# Patient Record
Sex: Female | Born: 1955 | Race: White | Hispanic: No | Smoking: Current every day smoker
Health system: Southern US, Community
[De-identification: ages and names within clinical notes are randomized; demographics above are authoritative.]

## PROBLEM LIST (undated history)

## (undated) DIAGNOSIS — F32A Depression, unspecified: Secondary | ICD-10-CM

## (undated) DIAGNOSIS — I10 Essential (primary) hypertension: Secondary | ICD-10-CM

## (undated) HISTORY — PX: ABDOMINAL SURGERY: SHX537

## (undated) HISTORY — PX: ABDOMINAL HYSTERECTOMY: SHX81

## (undated) HISTORY — PX: BREAST SURGERY: SHX581

---

## 2008-12-31 ENCOUNTER — Ambulatory Visit: Payer: Self-pay | Admitting: Oncology

## 2009-01-01 ENCOUNTER — Ambulatory Visit: Payer: Self-pay | Admitting: Oncology

## 2009-01-30 ENCOUNTER — Ambulatory Visit: Payer: Self-pay | Admitting: Oncology

## 2014-01-04 ENCOUNTER — Ambulatory Visit: Payer: Self-pay

## 2014-01-04 LAB — COMPREHENSIVE METABOLIC PANEL
AST: 36 U/L (ref 15–37)
Albumin: 3.6 g/dL (ref 3.4–5.0)
Alkaline Phosphatase: 111 U/L
Anion Gap: 7 (ref 7–16)
BILIRUBIN TOTAL: 0.9 mg/dL (ref 0.2–1.0)
BUN: 8 mg/dL (ref 7–18)
CO2: 26 mmol/L (ref 21–32)
CREATININE: 0.9 mg/dL (ref 0.60–1.30)
Calcium, Total: 9 mg/dL (ref 8.5–10.1)
Chloride: 106 mmol/L (ref 98–107)
EGFR (African American): >60
EGFR (Non-African Amer.): >60
Glucose: 112 mg/dL — ABNORMAL HIGH (ref 65–99)
Osmolality: 277 (ref 275–301)
Potassium: 4.2 mmol/L (ref 3.5–5.1)
SGPT (ALT): 37 U/L
Sodium: 139 mmol/L (ref 136–145)
Total Protein: 7.2 g/dL (ref 6.4–8.2)

## 2014-01-04 LAB — LIPID PANEL
CHOLESTEROL: 176 mg/dL (ref 0–200)
HDL: 62 mg/dL — AB (ref 40–60)
Ldl Cholesterol, Calc: 70 mg/dL (ref 0–100)
TRIGLYCERIDES: 219 mg/dL — AB (ref 0–200)
VLDL Cholesterol, Calc: 44 mg/dL — ABNORMAL HIGH (ref 5–40)

## 2014-01-04 LAB — CBC WITH DIFFERENTIAL/PLATELET
BASOS ABS: 0.1 10*3/uL (ref 0.0–0.1)
Basophil %: 1 %
Eosinophil #: 0.2 10*3/uL (ref 0.0–0.7)
Eosinophil %: 2.2 %
HCT: 54.6 % — ABNORMAL HIGH (ref 35.0–47.0)
HGB: 18.5 g/dL — AB (ref 12.0–16.0)
Lymphocyte #: 1.5 10*3/uL (ref 1.0–3.6)
Lymphocyte %: 18.9 %
MCH: 32.6 pg (ref 26.0–34.0)
MCHC: 34 g/dL (ref 32.0–36.0)
MCV: 96 fL (ref 80–100)
MONO ABS: 0.7 x10 3/mm (ref 0.2–0.9)
Monocyte %: 9.6 %
NEUTROS ABS: 5.3 10*3/uL (ref 1.4–6.5)
Neutrophil %: 68.3 %
PLATELETS: 239 10*3/uL (ref 150–440)
RBC: 5.69 10*6/uL — ABNORMAL HIGH (ref 3.80–5.20)
RDW: 12.4 % (ref 11.5–14.5)
WBC: 7.7 10*3/uL (ref 3.6–11.0)

## 2014-01-04 LAB — HEMOGLOBIN A1C: Hemoglobin A1C: 5.6 % (ref 4.2–6.3)

## 2014-01-04 LAB — TSH: Thyroid Stimulating Horm: 2.49 u[IU]/mL

## 2015-08-10 IMAGING — CR DG CHEST 2V
1 series · 2 of 2 positions shown · non-contrast
Comparison: None.

CLINICAL DATA: Cough

EXAM:
CHEST  2 VIEW

[Series 1: w chest pa · 0.14mm/px · 2 of 2 slices shown]
[im 1/2]
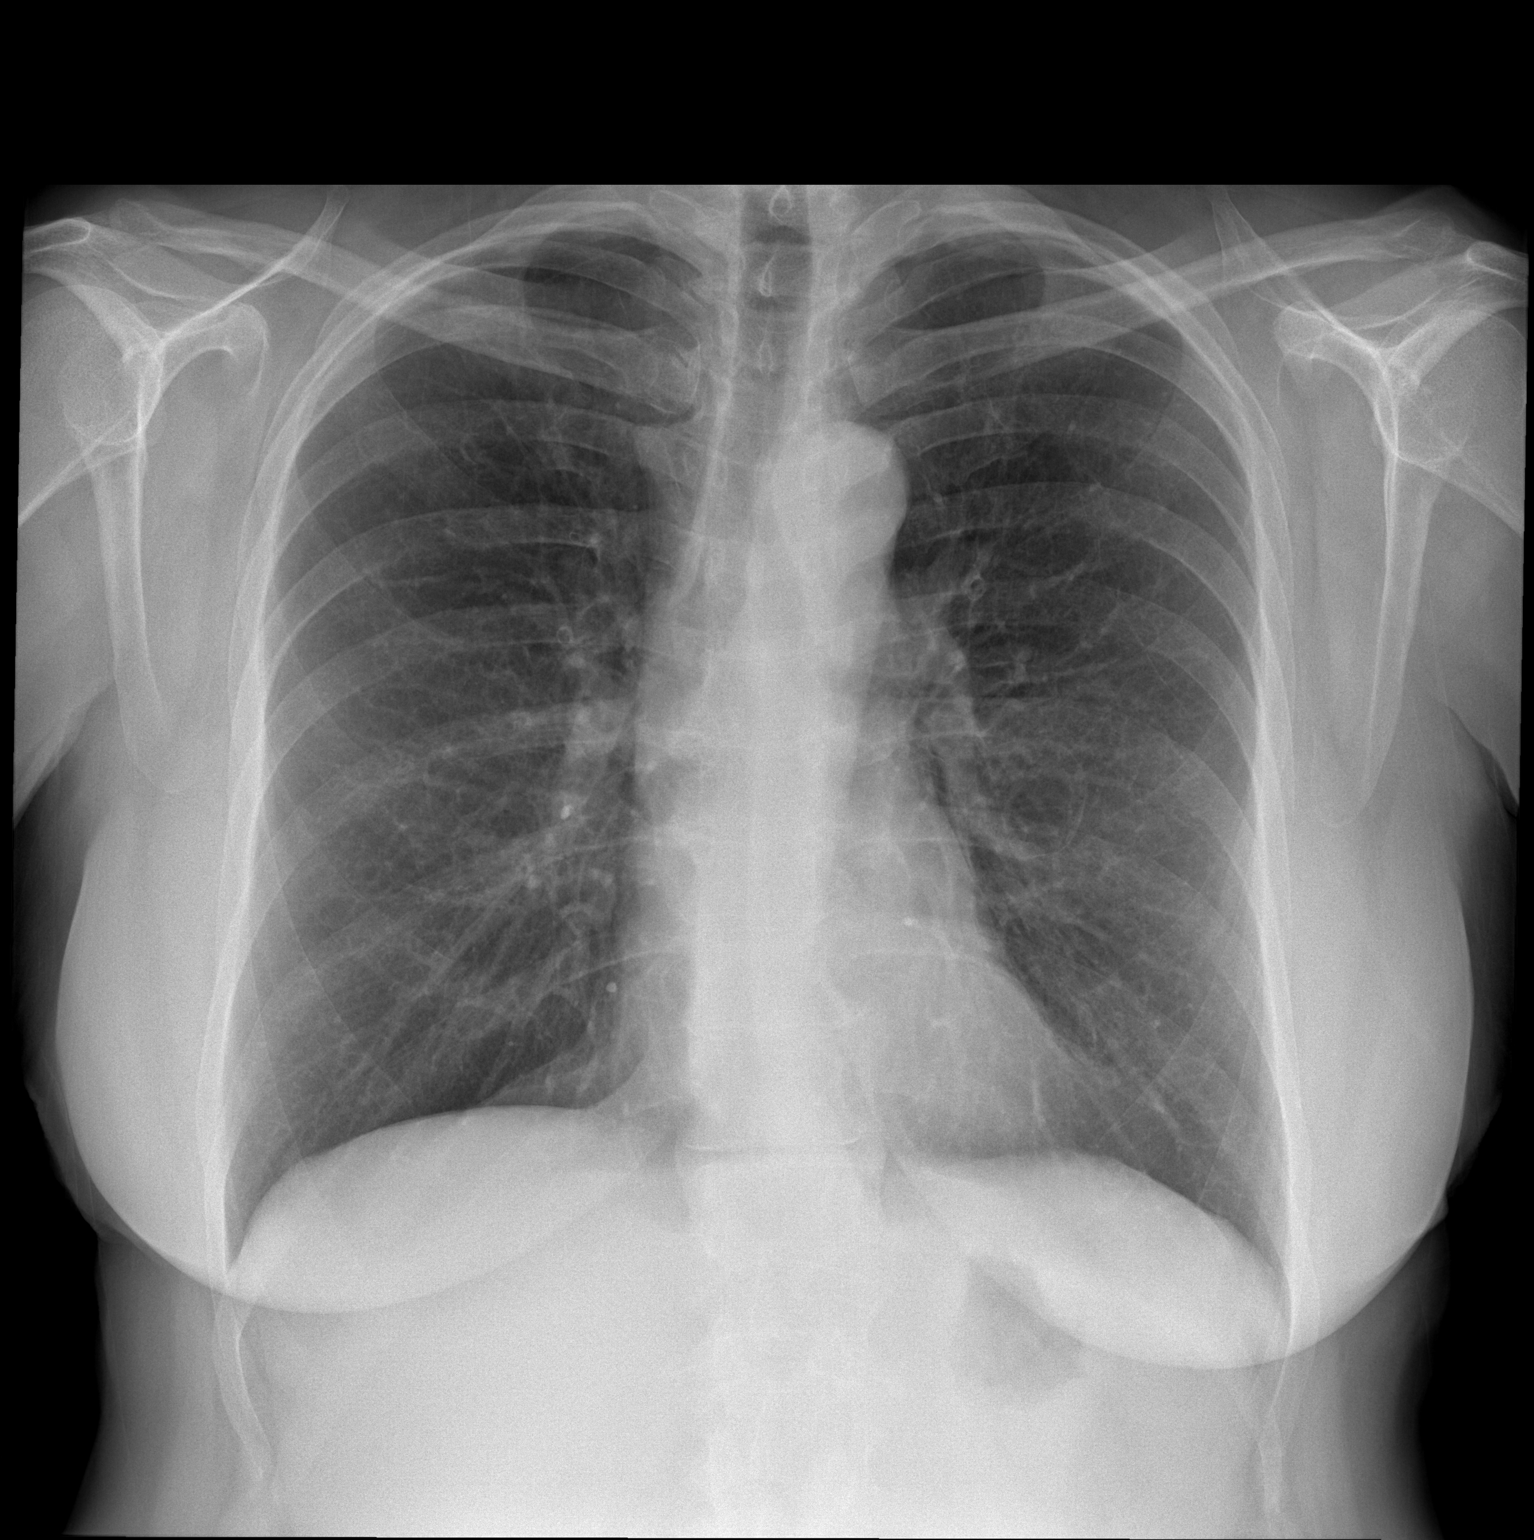
[im 2/2]
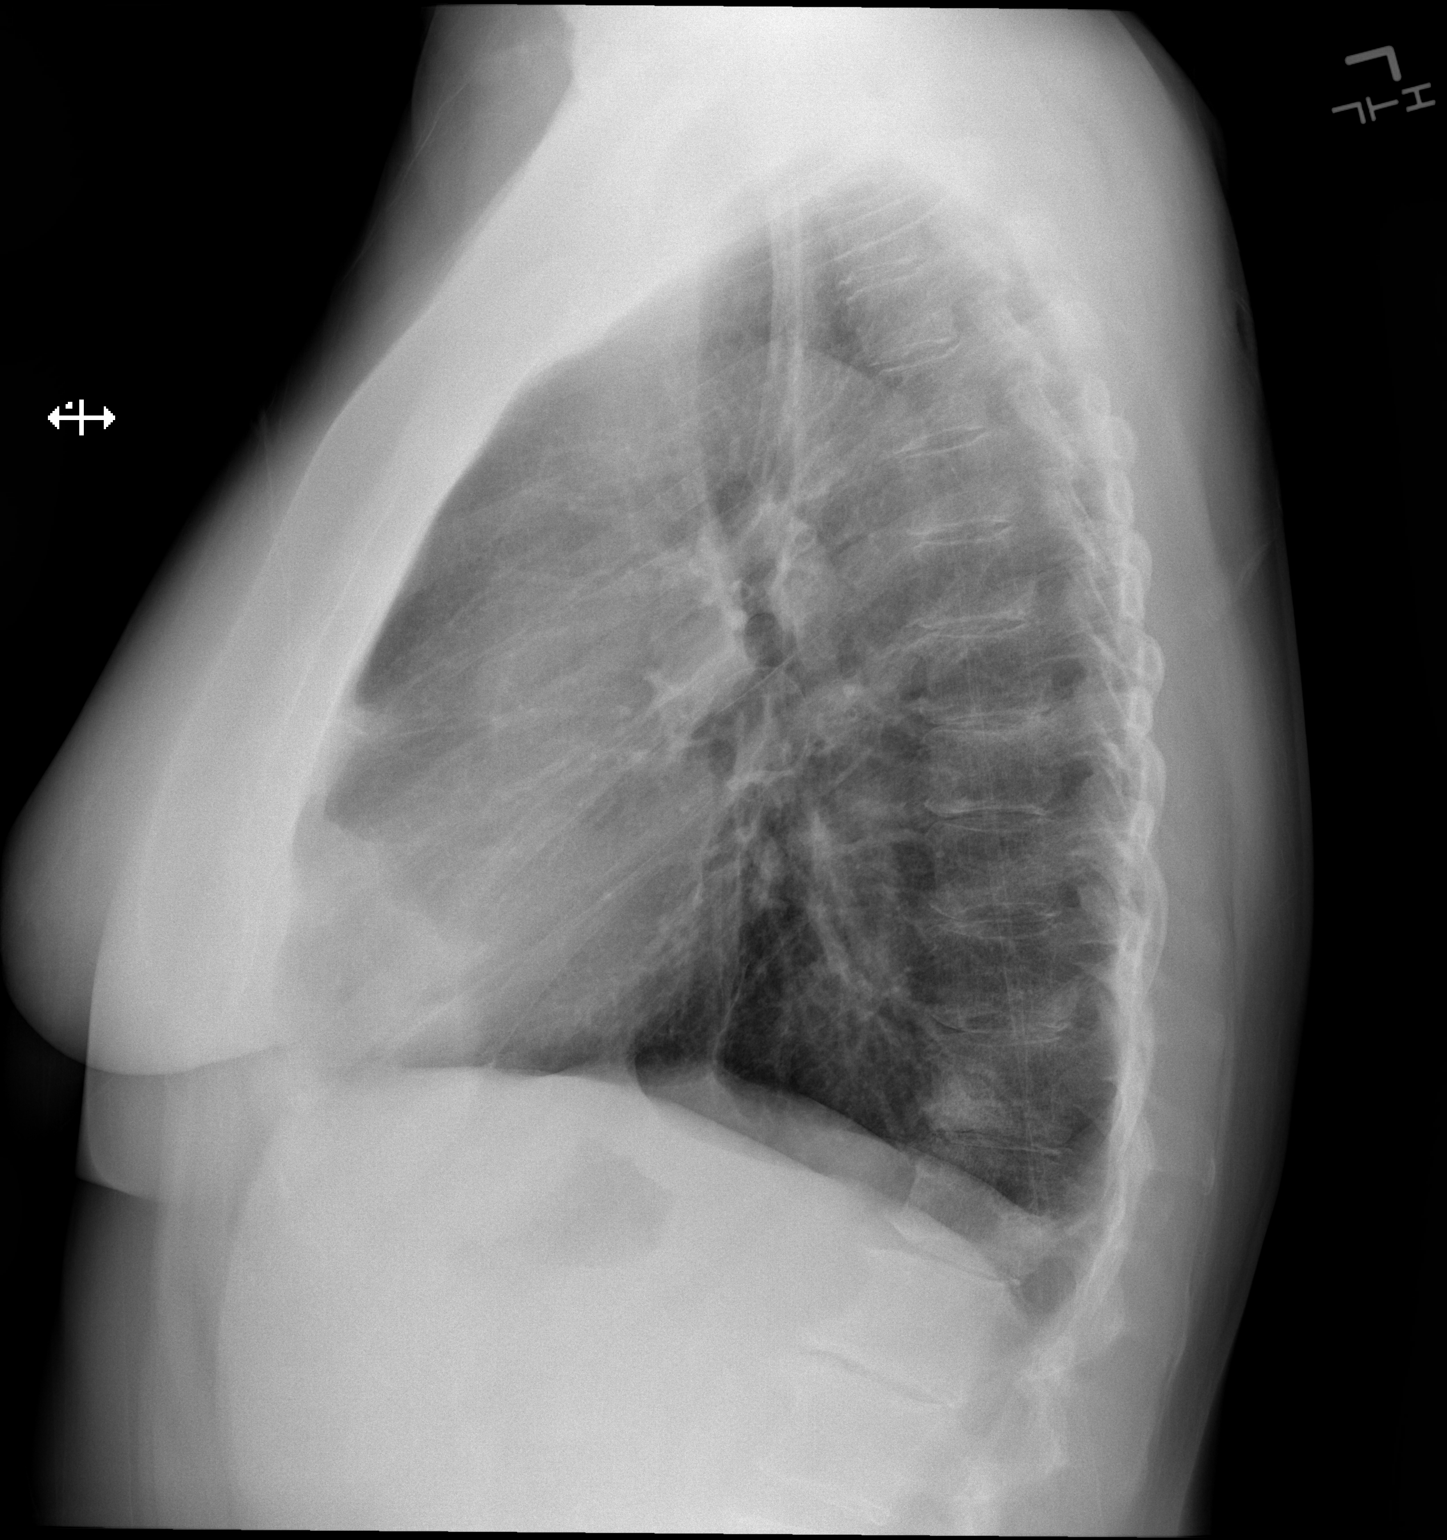

[2 of 2 positions shown; findings below may reference images not displayed]

FINDINGS: The heart size and mediastinal contours are within normal limits.
Both lungs are clear. The visualized skeletal structures are
unremarkable. There is focal kyphosis at the approximate T12 level
with approximately 5% wedge compression deformity anteriorly at that
level. This is of unknown chronicity.
IMPRESSION: No active cardiopulmonary disease.

## 2017-03-07 ENCOUNTER — Encounter: Payer: Self-pay | Admitting: Emergency Medicine

## 2017-03-07 ENCOUNTER — Other Ambulatory Visit: Payer: Self-pay

## 2017-03-07 ENCOUNTER — Emergency Department
Admission: EM | Admit: 2017-03-07 | Discharge: 2017-03-07 | Disposition: A | Discharge status: Home / Self Care | Payer: Managed Care, Other (non HMO) | Attending: Student in an Organized Health Care Education/Training Program | Admitting: Student in an Organized Health Care Education/Training Program

## 2017-03-07 DIAGNOSIS — I159 Secondary hypertension, unspecified: Secondary | ICD-10-CM

## 2017-03-07 DIAGNOSIS — F17210 Nicotine dependence, cigarettes, uncomplicated: Secondary | ICD-10-CM | POA: Diagnosis not present

## 2017-03-07 DIAGNOSIS — T465X6A Underdosing of other antihypertensive drugs, initial encounter: Secondary | ICD-10-CM | POA: Insufficient documentation

## 2017-03-07 HISTORY — DX: Essential (primary) hypertension: I10

## 2017-03-07 MED ORDER — AMLODIPINE BESYLATE 5 MG PO TABS: 5.0000 mg | ORAL_TABLET | Freq: Every day | ORAL | 0 refills | Status: AC

## 2017-03-07 NOTE — ED Notes (Signed)
Pt reports that she has hypertension and [facility] brought her to the ED - BP 155/112 at walk-in

## 2017-03-07 NOTE — ED Provider Notes (Signed)
[facility] Emergency Department Provider Note    First MD Initiated Contact with Patient 03/07/17 1918     (approximate)  I have reviewed the triage vital signs and the nursing notes.   HISTORY  Chief Complaint Hypertension    HPI Leslie James is a 61 y.o. female presents for evaluation of hypertension.  Patient stopped taking her lisinopril several months ago to 2 ACE inhibitor induced cough.  States her blood pressure has been elevated in the past several weeks.  Started noting some blurry vision but just recently got some new glasses.  No numbness or tingling.  States she has a mild headache but not severe.  Denies any chest pain.  No shortness of breath.  No lower extremity edema.  No orthopnea.  Past Medical History:  Diagnosis Date  . Hypertension    No family history on file. Past Surgical History:  Procedure Laterality Date  . ABDOMINAL HYSTERECTOMY    . ABDOMINAL SURGERY    . BREAST SURGERY     Tumor removal; benign   There are no active problems to display for this patient.     Prior to Admission medications   Not on File    Allergies Codeine and Erythromycin    Social History Social History   Tobacco Use  . Smoking status: Current Every Day Smoker    Types: Cigarettes  . Smokeless tobacco: Never Used  Substance Use Topics  . Alcohol use: Yes    Alcohol/week: 12.6 oz    Types: 21 Cans of beer per week    Frequency: Never  . Drug use: No    Review of Systems Patient denies headaches, rhinorrhea, blurry vision, numbness, shortness of breath, chest pain, edema, cough, abdominal pain, nausea, vomiting, diarrhea, dysuria, fevers, rashes or hallucinations unless otherwise stated above in HPI. ____________________________________________   PHYSICAL EXAM:  VITAL SIGNS: Vitals:   03/07/17 1726  BP: (!) 163/105  Pulse: (!) 103  Resp: 16  Temp: 98.2 F (36.8 C)  SpO2: 100%    Constitutional: Alert and oriented.  Well appearing and in no acute distress. Eyes: Conjunctivae are normal. Crisp optic disc Head: Atraumatic. Nose: No congestion/rhinnorhea. Mouth/Throat: Mucous membranes are moist.   Neck: No stridor. Painless ROM.  Cardiovascular: Normal rate, regular rhythm. Grossly normal heart sounds.  Good peripheral circulation. Respiratory: Normal respiratory effort.  No retractions. Lungs CTAB. Gastrointestinal: Soft and nontender. No distention. No abdominal bruits. No CVA tenderness. Genitourinary:  Musculoskeletal: No lower extremity tenderness nor edema.  No joint effusions. Neurologic:  Normal speech and language. No gross focal neurologic deficits are appreciated. No facial droop Skin:  Skin is warm, dry and intact. No rash noted. Psychiatric: Mood and affect are normal. Speech and behavior are normal.  ____________________________________________   LABS (all labs ordered are listed, but only abnormal results are displayed)  No results found for this or any previous visit (from the past 24 hour(s)). ____________________________________________ ________________________________   PROCEDURES  Procedure(s) performed:  Procedures    Critical Care performed: no ____________________________________________   INITIAL IMPRESSION / ASSESSMENT AND PLAN / ED COURSE  Pertinent labs & imaging results that were available during my care of the patient were reviewed by me and considered in my medical decision making (see chart for details).  DDX: htn, medication, noncompliance, stress  Leslie James is a 61 y.o. who presents to the ED with chief complaint of headaches and hypertension as described above.  She has no meningeal irritation.  No focal neuro deficits.  Blood pressure is only mildly elevated.  No chest pain or shortness of breath.  Patient has grossly asymptomatic hypertension.  Her abdominal exam is soft and benign.  No lower extremity edema.  Patient will be started on Norvasc due  to previous intolerances with other medications.  Patient will be given referral to PCP for further evaluation.      ____________________________________________   FINAL CLINICAL IMPRESSION(S) / ED DIAGNOSES  Final diagnoses:  Secondary hypertension      NEW MEDICATIONS STARTED DURING THIS VISIT:  This SmartLink is deprecated. Use AVSMEDLIST instead to display the medication list for a patient.   Note:  This document was prepared using Dragon voice recognition software and may include unintentional dictation errors.    Merlyn Lot, MD 03/07/17 2006

## 2017-03-07 NOTE — ED Triage Notes (Signed)
Pt sent over from [facility] due to hypertension and headaches x approximately one week, pt reports she used to be on Lisinopril but came off of it due to side effects without following up.  Pt reports intermittent blurred vision, denies any dizziness.  NAD noted at this time.

## 2021-05-06 ENCOUNTER — Encounter: Payer: Self-pay | Admitting: Gastroenterology

## 2021-05-06 NOTE — Anesthesia Preprocedure Evaluation (Addendum)
Anesthesia Evaluation  Patient identified by MRN, date of birth, ID band Patient awake    Reviewed: Allergy & Precautions, Patient's Chart, lab work & pertinent test results  History of Anesthesia Complications (+) PONV and history of anesthetic complications  Airway Mallampati: II  TM Distance: >3 FB Neck ROM: Full    Dental no notable dental hx.    Pulmonary asthma , Current Smoker,    Pulmonary exam normal breath sounds clear to auscultation       Cardiovascular hypertension, Normal cardiovascular exam Rhythm:Regular Rate:Normal     Neuro/Psych Depression    GI/Hepatic   Endo/Other    Renal/GU      Musculoskeletal   Abdominal   Peds  Hematology   Anesthesia Other Findings   Reproductive/Obstetrics                           Anesthesia Physical Anesthesia Plan  ASA: 3  Anesthesia Plan: General   Post-op Pain Management:    Induction: Intravenous  PONV Risk Score and Plan:   Airway Management Planned: Natural Airway and Nasal Cannula  Additional Equipment:   Intra-op Plan:   Post-operative Plan:   Informed Consent: I have reviewed the patients History and Physical, chart, labs and discussed the procedure including the risks, benefits and alternatives for the proposed anesthesia with the patient or authorized representative who has indicated his/her understanding and acceptance.     Dental Advisory Given  Plan Discussed with: Anesthesiologist, CRNA and Surgeon  Anesthesia Plan Comments: (Patient consented for risks of anesthesia including but not limited to:  - adverse reactions to medications - risk of airway placement if required - damage to eyes, teeth, lips or other oral mucosa - nerve damage due to positioning  - sore throat or hoarseness - Damage to heart, brain, nerves, lungs, other parts of body or loss of life  Patient voiced understanding.)         Anesthesia Quick Evaluation

## 2021-05-06 NOTE — H&P (Signed)
Pre-Procedure H&P   Patient ID: Leslie James is a 66 y.o. female.  Gastroenterology Provider: Annamaria Helling, DO  PCP: Baxter Hire, MD  Date: 05/07/2021  HPI Ms. Leslie James is a 66 y.o. female who presents today for Colonoscopy for positive Cologuard.  Patient has not had previous endoscopy.  Had positive Cologuard testing in August 2022.  She reports normal bowel movements without hematochezia or melena.  She has a history of recurrent fissure requiring anal sphincterotomy and has not had any current issues since her procedure.  She has history of polycythemia with latest hemoglobin being 17.8.  She does have a history of tobacco use 1 pack/day.  Status post hysterectomy  No family history of colorectal cancer or polyps.   Past Medical History:  Diagnosis Date   Depression    Hypertension     Past Surgical History:  Procedure Laterality Date   ABDOMINAL HYSTERECTOMY     ABDOMINAL SURGERY     BREAST SURGERY     Tumor removal; benign    Family History No h/o GI disease or malignancy  Review of Systems  Constitutional:  Negative for activity change, appetite change, chills, diaphoresis, fatigue, fever and unexpected weight change.  HENT:  Negative for trouble swallowing and voice change.   Respiratory:  Negative for shortness of breath and wheezing.   Cardiovascular:  Negative for chest pain, palpitations and leg swelling.  Gastrointestinal:  Negative for abdominal distention, abdominal pain, anal bleeding, blood in stool, constipation, diarrhea, nausea, rectal pain and vomiting.  Musculoskeletal:  Negative for arthralgias and myalgias.  Skin:  Negative for color change and pallor.  Neurological:  Negative for dizziness, syncope and weakness.  Psychiatric/Behavioral:  Negative for confusion.   All other systems reviewed and are negative.   Medications No current facility-administered medications on file prior to encounter.   Current Outpatient  Medications on File Prior to Encounter  Medication Sig Dispense Refill   cholecalciferol (VITAMIN D3) 25 MCG (1000 UNIT) tablet Take 1,000 Units by mouth daily.     escitalopram (LEXAPRO) 5 MG tablet Take 5 mg by mouth daily.     hydrochlorothiazide (MICROZIDE) 12.5 MG capsule Take 12.5 mg by mouth daily.     losartan (COZAAR) 25 MG tablet Take 25 mg by mouth daily.     montelukast (SINGULAIR) 10 MG tablet Take 10 mg by mouth at bedtime.     amLODipine (NORVASC) 5 MG tablet Take 1 tablet (5 mg total) daily by mouth. 30 tablet 0    Pertinent medications related to GI and procedure were reviewed by me with the patient prior to the procedure   Current Facility-Administered Medications:    0.9 %  sodium chloride infusion, , Intravenous, Continuous, Annamaria Helling, DO      Allergies  Allergen Reactions   Bee Venom    Codeine Other (See Comments)    Petichiae   Erythromycin Diarrhea    Abdominal pain   Allergies were reviewed by me prior to the procedure  Objective    Vitals:   05/07/21 1102  BP: (!) 158/90  Pulse: 83  Resp: 18  Temp: (!) 97.3 F (36.3 C)  TempSrc: Temporal  SpO2: 99%  Weight: 59 kg  Height: 5\' 2"  (1.575 m)      Physical Exam Vitals and nursing note reviewed.  Constitutional:      General: She is not in acute distress.    Appearance: Normal appearance. She is not ill-appearing, toxic-appearing or  diaphoretic.  HENT:     Head: Normocephalic and atraumatic.     Nose: Nose normal.     Mouth/Throat:     Mouth: Mucous membranes are moist.     Pharynx: Oropharynx is clear.  Eyes:     General: No scleral icterus.    Extraocular Movements: Extraocular movements intact.  Cardiovascular:     Rate and Rhythm: Normal rate and regular rhythm.     Heart sounds: Normal heart sounds. No murmur heard.   No friction rub. No gallop.  Pulmonary:     Effort: Pulmonary effort is normal. No respiratory distress.     Breath sounds: Normal breath sounds. No  wheezing, rhonchi or rales.  Abdominal:     General: Bowel sounds are normal. There is no distension.     Palpations: Abdomen is soft.     Tenderness: There is no abdominal tenderness. There is no guarding or rebound.  Musculoskeletal:     Cervical back: Neck supple.     Right lower leg: No edema.     Left lower leg: No edema.  Skin:    General: Skin is warm and dry.     Coloration: Skin is not jaundiced or pale.  Neurological:     General: No focal deficit present.     Mental Status: She is alert and oriented to person, place, and time. Mental status is at baseline.  Psychiatric:        Mood and Affect: Mood normal.        Behavior: Behavior normal.        Thought Content: Thought content normal.        Judgment: Judgment normal.     Assessment:  Ms. Leslie James is a 66 y.o. female  who presents today for Colonoscopy for positive Cologuard.  Plan:  Colonoscopy with possible intervention today  Colonoscopy with possible biopsy, control of bleeding, polypectomy, and interventions as necessary has been discussed with the patient/patient representative. Informed consent was obtained from the patient/patient representative after explaining the indication, nature, and risks of the procedure including but not limited to death, bleeding, perforation, missed neoplasm/lesions, cardiorespiratory compromise, and reaction to medications. Opportunity for questions was given and appropriate answers were provided. Patient/patient representative has verbalized understanding is amenable to undergoing the procedure.   Annamaria Helling, DO  [facility] Gastroenterology  Portions of the record may have been created with voice recognition software. Occasional wrong-word or 'sound-a-like' substitutions may have occurred due to the inherent limitations of voice recognition software.  Read the chart carefully and recognize, using context, where substitutions may have occurred.

## 2021-05-07 ENCOUNTER — Encounter
Admission: RE | Disposition: A | Discharge status: Home / Self Care | Payer: Self-pay | Source: Home / Self Care | Attending: Gastroenterology

## 2021-05-07 ENCOUNTER — Encounter: Payer: Self-pay | Admitting: Gastroenterology

## 2021-05-07 ENCOUNTER — Other Ambulatory Visit: Payer: Self-pay

## 2021-05-07 ENCOUNTER — Ambulatory Visit: Payer: Managed Care, Other (non HMO) | Admitting: Anesthesiology

## 2021-05-07 ENCOUNTER — Ambulatory Visit
Admission: RE | Admit: 2021-05-07 | Discharge: 2021-05-07 | Disposition: A | Discharge status: Home / Self Care | Payer: Managed Care, Other (non HMO) | Attending: Gastroenterology | Admitting: Gastroenterology

## 2021-05-07 DIAGNOSIS — R195 Other fecal abnormalities: Secondary | ICD-10-CM | POA: Diagnosis present

## 2021-05-07 DIAGNOSIS — F17210 Nicotine dependence, cigarettes, uncomplicated: Secondary | ICD-10-CM | POA: Insufficient documentation

## 2021-05-07 DIAGNOSIS — D125 Benign neoplasm of sigmoid colon: Secondary | ICD-10-CM | POA: Insufficient documentation

## 2021-05-07 DIAGNOSIS — D124 Benign neoplasm of descending colon: Secondary | ICD-10-CM | POA: Insufficient documentation

## 2021-05-07 DIAGNOSIS — D122 Benign neoplasm of ascending colon: Secondary | ICD-10-CM | POA: Insufficient documentation

## 2021-05-07 DIAGNOSIS — I10 Essential (primary) hypertension: Secondary | ICD-10-CM | POA: Insufficient documentation

## 2021-05-07 DIAGNOSIS — J45909 Unspecified asthma, uncomplicated: Secondary | ICD-10-CM | POA: Insufficient documentation

## 2021-05-07 DIAGNOSIS — D127 Benign neoplasm of rectosigmoid junction: Secondary | ICD-10-CM | POA: Insufficient documentation

## 2021-05-07 DIAGNOSIS — F32A Depression, unspecified: Secondary | ICD-10-CM | POA: Insufficient documentation

## 2021-05-07 DIAGNOSIS — D123 Benign neoplasm of transverse colon: Secondary | ICD-10-CM | POA: Insufficient documentation

## 2021-05-07 DIAGNOSIS — D751 Secondary polycythemia: Secondary | ICD-10-CM | POA: Diagnosis not present

## 2021-05-07 DIAGNOSIS — Z1211 Encounter for screening for malignant neoplasm of colon: Secondary | ICD-10-CM | POA: Diagnosis not present

## 2021-05-07 DIAGNOSIS — Z90710 Acquired absence of both cervix and uterus: Secondary | ICD-10-CM | POA: Diagnosis not present

## 2021-05-07 HISTORY — DX: Depression, unspecified: F32.A

## 2021-05-07 HISTORY — PX: COLONOSCOPY: SHX5424

## 2021-05-07 SURGERY — COLONOSCOPY
Anesthesia: General

## 2021-05-07 MED ORDER — SODIUM CHLORIDE 0.9 % IV SOLN: INTRAVENOUS | Status: DC

## 2021-05-07 MED ORDER — LIDOCAINE 2% (20 MG/ML) 5 ML SYRINGE
INTRAMUSCULAR | Status: DC | PRN
  Administered 2021-05-07: 20 mg via INTRAVENOUS

## 2021-05-07 MED ORDER — GLYCOPYRROLATE 0.2 MG/ML IJ SOLN
INTRAMUSCULAR | Status: DC | PRN
  Administered 2021-05-07: .2 mg via INTRAVENOUS

## 2021-05-07 MED ORDER — PROPOFOL 500 MG/50ML IV EMUL
INTRAVENOUS | Status: DC | PRN
  Administered 2021-05-07: 120 ug/kg/min via INTRAVENOUS

## 2021-05-07 MED ORDER — PROPOFOL 10 MG/ML IV BOLUS
INTRAVENOUS | Status: DC | PRN
Start: 2021-05-07 — End: 2021-05-07
  Administered 2021-05-07: 30 mg via INTRAVENOUS
  Administered 2021-05-07: 70 mg via INTRAVENOUS
  Administered 2021-05-07: 30 mg via INTRAVENOUS

## 2021-05-07 NOTE — Op Note (Signed)
[facility] Gastroenterology Patient Name: Leslie James Procedure Date: 05/07/2021 11:37 AM MRN: [id number] Account #: [id number] Date of Birth: October 03, 1955 Admit Type: Outpatient Age: 66 Room: [facility] ENDO ROOM 1 Gender: Female Note Status: Finalized Instrument Name: Colonoscope [id number] Procedure:             Colonoscopy Indications:           Positive Cologuard test Providers:             Annamaria Helling DO, DO Medicines:             Monitored Anesthesia Care Complications:         No immediate complications. Estimated blood loss:                         Minimal. Procedure:             Pre-Anesthesia Assessment:                        - Prior to the procedure, a History and Physical was                         performed, and patient medications and allergies were                         reviewed. The patient is competent. The risks and                         benefits of the procedure and the sedation options and                         risks were discussed with the patient. All questions                         were answered and informed consent was obtained.                         Patient identification and proposed procedure were                         verified by the physician, the nurse, the anesthetist                         and the technician in the endoscopy suite. Mental                         Status Examination: alert and oriented. Airway                         Examination: normal oropharyngeal airway and neck                         mobility. Respiratory Examination: clear to                         auscultation. CV Examination: RRR, no murmurs, no S3                         or S4. Prophylactic Antibiotics: The patient does not  require prophylactic antibiotics. Prior                         Anticoagulants: The patient has taken no previous                         anticoagulant or antiplatelet agents. ASA Grade                          Assessment: III - A patient with severe systemic                         disease. After reviewing the risks and benefits, the                         patient was deemed in satisfactory condition to                         undergo the procedure. The anesthesia plan was to use                         monitored anesthesia care (MAC). Immediately prior to                         administration of medications, the patient was                         re-assessed for adequacy to receive sedatives. The                         heart rate, respiratory rate, oxygen saturations,                         blood pressure, adequacy of pulmonary ventilation, and                         response to care were monitored throughout the                         procedure. The physical status of the patient was                         re-assessed after the procedure.                        After obtaining informed consent, the colonoscope was                         passed under direct vision. Throughout the procedure,                         the patient's blood pressure, pulse, and oxygen                         saturations were monitored continuously. The                         Colonoscope was introduced through the anus and  advanced to the the terminal ileum, with                         identification of the appendiceal orifice and IC                         valve. The colonoscopy was performed without                         difficulty. The patient tolerated the procedure well.                         The quality of the bowel preparation was evaluated                         using the BBPS [facility] Bowel Preparation Scale) with                         scores of: Right Colon = 3, Transverse Colon = 3 and                         Left Colon = 3 (entire mucosa seen well with no                         residual staining, small fragments of stool or opaque                          liquid). The total BBPS score equals 9. The terminal                         ileum, ileocecal valve, appendiceal orifice, and                         rectum were photographed. Findings:      The perianal and digital rectal examinations were normal. Pertinent       negatives include normal sphincter tone.      The terminal ileum appeared normal. Polyp involving the distal portion       of the IC Valve- does not appear to involve TI upon intubating. In       retroflexion, it does not appear to affect the proximal lip of the       valve. This area was not biopsied or resected. Estimated blood loss:       none.      A 11 to 13 mm polyp was found at 55 cm proximal to the anus. The polyp       was sessile. Polypectomy was not attempted due to spreading laterally.       Area adjacent was tattooed (1.5cc ink). Estimated blood loss was minimal.      Two sessile polyps were found in the sigmoid colon. The polyps were 4 to       5 mm in size. These polyps were removed with a cold snare. Resection and       retrieval were complete. Estimated blood loss was minimal.      A 1 to 2 mm polyp was found in the sigmoid colon. The polyp was sessile.       The polyp was removed with a cold biopsy  forceps. Resection and       retrieval were complete. Estimated blood loss was minimal.      A 4 to 5 mm polyp was found in the descending colon. The polyp was       sessile. The polyp was removed with a cold snare. Resection and       retrieval were complete. Estimated blood loss was minimal.      A 4 to 5 mm polyp was found in the ascending colon. The polyp was       sessile. The polyp was removed with a cold snare. Resection and       retrieval were complete. Estimated blood loss was minimal.      A 1 to 2 mm polyp was found in the ascending colon. The polyp was       sessile. The polyp was removed with a cold biopsy forceps. Resection and       retrieval were complete. Estimated blood loss was minimal.      A 1  to 2 mm polyp was found in the transverse colon. The polyp was       sessile. The polyp was removed with a cold biopsy forceps. Resection and       retrieval were complete. Estimated blood loss was minimal.      A 4 to 6 mm polyp was found in the rectum. The polyp was sessile. The       polyp was removed with a cold snare. Resection and retrieval were       complete. Estimated blood loss was minimal.      The exam was otherwise without abnormality on direct and retroflexion       views. Impression:            - The examined portion of the ileum was normal.                        - One 11 to 13 mm polyp at 55 cm proximal to the anus.                         Resection not attempted.                        - Two 4 to 5 mm polyps in the sigmoid colon, removed                         with a cold snare. Resected and retrieved.                        - One 1 to 2 mm polyp in the sigmoid colon, removed                         with a cold biopsy forceps. Resected and retrieved.                        - One 4 to 5 mm polyp in the descending colon, removed                         with a cold snare. Resected and retrieved.                        -  One 4 to 5 mm polyp in the ascending colon, removed                         with a cold snare. Resected and retrieved.                        - One 1 to 2 mm polyp in the ascending colon, removed                         with a cold biopsy forceps. Resected and retrieved.                        - One 1 to 2 mm polyp in the transverse colon, removed                         with a cold biopsy forceps. Resected and retrieved.                        - One 4 to 6 mm polyp in the rectum, removed with a                         cold snare. Resected and retrieved.                        - The examination was otherwise normal on direct and                         retroflexion views. Recommendation:        - Discharge patient to home.                        - Resume  previous diet.                        - No aspirin, ibuprofen, naproxen, or other                         non-steroidal anti-inflammatory drugs for 5 days after                         polyp removal.                        - Await pathology results.                        - Repeat colonoscopy with advanced endoscopist to be                         scheduled.                        - Refer to Advanced Endoscopist (GI) at appointment to                         be scheduled.                        - The findings and recommendations were discussed with  the patient's family.                        - The findings and recommendations were discussed with                         the patient. Procedure Code(s):     --- Professional ---                        [phone], Colonoscopy, flexible; with removal of                         tumor(s), polyp(s), or other lesion(s) by snare                         technique                        45380, 59, Colonoscopy, flexible; with biopsy, single                         or multiple Diagnosis Code(s):     --- Professional ---                        K63.5, Polyp of colon                        K62.1, Rectal polyp                        R19.5, Other fecal abnormalities CPT copyright 2019 [employer] Association. All rights reserved. The codes documented in this report are preliminary and upon coder review may  be revised to meet current compliance requirements. Attending Participation:      I personally performed the entire procedure. Volney American, DO Annamaria Helling DO, DO 05/07/2021 12:54:02 PM This report has been signed electronically. Number of Addenda: 0 Note Initiated On: 05/07/2021 11:37 AM Scope Withdrawal Time: 0 hours 21 minutes 43 seconds  Total Procedure Duration: 0 hours 39 minutes 44 seconds  Estimated Blood Loss:  Estimated blood loss was minimal.      [facility]

## 2021-05-07 NOTE — Progress Notes (Signed)
Dr. Virgina Jock spoke with sister and patient about procedure results. Patient understands instructions on seeking medical help if bleeding occurs after discharge

## 2021-05-07 NOTE — Transfer of Care (Signed)
Immediate Anesthesia Transfer of Care Note  Patient: Leslie James  Procedure(s) Performed: COLONOSCOPY  Patient Location: PACU and Endoscopy Unit  Anesthesia Type:General  Level of Consciousness: awake and alert   Airway & Oxygen Therapy: Patient Spontanous Breathing  Post-op Assessment: Report given to RN and Post -op Vital signs reviewed and stable  Post vital signs: Reviewed  Last Vitals:  Vitals Value Taken Time  BP    Temp    Pulse    Resp    SpO2      Last Pain:  Vitals:   05/07/21 1102  TempSrc: Temporal  PainSc: 0-No pain         Complications: No notable events documented.

## 2021-05-07 NOTE — Anesthesia Postprocedure Evaluation (Signed)
Anesthesia Post Note  Patient: Leslie James  Procedure(s) Performed: COLONOSCOPY  Anesthesia Type: General Anesthetic complications: no   There were no known notable events for this encounter.   Last Vitals:  Vitals:   05/07/21 1102 05/07/21 1242  BP: (!) 158/90 114/78  Pulse: 83 86  Resp: 18 14  Temp: (!) 36.3 C (!) 35.6 C  SpO2: 99% 97%    Last Pain:  Vitals:   05/07/21 1242  TempSrc: Temporal  PainSc: 0-No pain                 [place]

## 2021-05-07 NOTE — Interval H&P Note (Signed)
History and Physical Interval Note: Preprocedure H&P from 05/07/21  was reviewed and there was no interval change after seeing and examining the patient.  Written consent was obtained from the patient after discussion of risks, benefits, and alternatives. Patient has consented to proceed with Colonoscopy with possible intervention   05/07/2021 11:45 AM  Leslie James  has presented today for surgery, with the diagnosis of Positive colorectal cancer screening using Cologuard test (R19.5).  The various methods of treatment have been discussed with the patient and family. After consideration of risks, benefits and other options for treatment, the patient has consented to  Procedure(s): COLONOSCOPY (N/A) as a surgical intervention.  The patient's history has been reviewed, patient examined, no change in status, stable for surgery.  I have reviewed the patient's chart and labs.  Questions were answered to the patient's satisfaction.     Annamaria Helling

## 2021-05-10 ENCOUNTER — Encounter: Payer: Self-pay | Admitting: Gastroenterology

## 2021-05-10 LAB — SURGICAL PATHOLOGY

## 2022-09-19 ENCOUNTER — Encounter: Payer: Self-pay | Admitting: Emergency Medicine

## 2022-09-19 ENCOUNTER — Ambulatory Visit
Admission: EM | Admit: 2022-09-19 | Discharge: 2022-09-19 | Disposition: A | Discharge status: Home / Self Care | Payer: Managed Care, Other (non HMO) | Attending: Urgent Care | Admitting: Urgent Care

## 2022-09-19 DIAGNOSIS — S91359A Open bite, unspecified foot, initial encounter: Secondary | ICD-10-CM | POA: Diagnosis not present

## 2022-09-19 DIAGNOSIS — W5501XA Bitten by cat, initial encounter: Secondary | ICD-10-CM | POA: Diagnosis not present

## 2022-09-19 MED ORDER — AMOXICILLIN-POT CLAVULANATE 875-125 MG PO TABS: 1.0000 | ORAL_TABLET | Freq: Two times a day (BID) | ORAL | 0 refills | Status: DC

## 2022-09-19 MED ORDER — SULFAMETHOXAZOLE-TRIMETHOPRIM 800-160 MG PO TABS: 1.0000 | ORAL_TABLET | Freq: Two times a day (BID) | ORAL | 0 refills | Status: AC

## 2022-09-19 MED ORDER — CLINDAMYCIN HCL 300 MG PO CAPS
300.0000 mg | ORAL_CAPSULE | Freq: Three times a day (TID) | ORAL | 0 refills | Status: AC
Start: 2022-09-19 — End: 2022-09-29

## 2022-09-19 NOTE — ED Provider Notes (Addendum)
[facility]    CSN: [id number] Arrival date & time: 09/19/22  1001      History   Chief Complaint No chief complaint on file.   HPI Leslie James is a 67 y.o. female.   HPI  Presents to urgent care with complaint of cat scratch x 3 days.  Patient states that she was scratched by a neighborhood cat on her L foot when she was playing with the animal. She has been cleaning and using bacitracin.  Past Medical History:  Diagnosis Date   Depression    Hypertension     There are no problems to display for this patient.   Past Surgical History:  Procedure Laterality Date   ABDOMINAL HYSTERECTOMY     ABDOMINAL SURGERY     BREAST SURGERY     Tumor removal; benign   COLONOSCOPY N/A 05/07/2021   Procedure: COLONOSCOPY;  Surgeon: Jaynie Collins, DO;  Location: [facility] ENDOSCOPY;  Service: Gastroenterology;  Laterality: N/A;    OB History   No obstetric history on file.      Home Medications    Prior to Admission medications   Medication Sig Start Date End Date Taking? Authorizing Provider  amLODipine (NORVASC) 5 MG tablet Take 1 tablet (5 mg total) daily by mouth. 03/07/17 03/07/18  Willy Eddy, MD  cholecalciferol (VITAMIN D3) 25 MCG (1000 UNIT) tablet Take 1,000 Units by mouth daily.    [provider]  escitalopram (LEXAPRO) 5 MG tablet Take 5 mg by mouth daily.    [provider]  hydrochlorothiazide (MICROZIDE) 12.5 MG capsule Take 12.5 mg by mouth daily.    [provider]  losartan (COZAAR) 25 MG tablet Take 25 mg by mouth daily.    [provider]  montelukast (SINGULAIR) 10 MG tablet Take 10 mg by mouth at bedtime.    [provider]    Family History No family history on file.  Social History Social History   Tobacco Use   Smoking status: Every Day    Packs/day: 1.00    Years: 30.00    Additional pack years: 0.00    Total pack years: 30.00    Types: Cigarettes   Smokeless tobacco: Never   Vaping Use   Vaping Use: Never used  Substance Use Topics   Alcohol use: Yes    Alcohol/week: 15.0 standard drinks of alcohol    Types: 15 Cans of beer per week    Comment: 3-4 beers 3-4x week   Drug use: No     Allergies   Bee venom, Codeine, and Erythromycin   Review of Systems Review of Systems   Physical Exam Triage Vital Signs ED Triage Vitals  Enc Vitals Group     BP      Pulse      Resp      Temp      Temp src      SpO2      Weight      Height      Head Circumference      Peak Flow      Pain Score      Pain Loc      Pain Edu?      Excl. in GC?    No data found.  Updated Vital Signs There were no vitals taken for this visit.  Visual Acuity Right Eye Distance:   Left Eye Distance:   Bilateral Distance:    Right Eye Near:   Left Eye  Near:    Bilateral Near:     Physical Exam Vitals reviewed.  Constitutional:      Appearance: Normal appearance.  Musculoskeletal:       Feet:  Skin:    General: Skin is warm and dry.  Neurological:     General: No focal deficit present.     Mental Status: She is alert and oriented to person, place, and time.  Psychiatric:        Mood and Affect: Mood normal.        Behavior: Behavior normal.      UC Treatments / Results  Labs (all labs ordered are listed, but only abnormal results are displayed) Labs Reviewed - No data to display  EKG   Radiology No results found.  Procedures Procedures (including critical care time)  Medications Ordered in UC Medications - No data to display  Initial Impression / Assessment and Plan / UC Course  I have reviewed the triage vital signs and the nursing notes.  Pertinent labs & imaging results that were available during my care of the patient were reviewed by me and considered in my medical decision making (see chart for details).   Leslie James is a 67 y.o. female presenting with cat bite to L foot. Patient is afebrile without recent antipyretics, satting  well on room air. Overall is well appearing though non-toxic, well hydrated, without respiratory distress.  4 puncture marks on the plantar surface of her left midfoot is present.  Oozing purulent discharge from all 4 puncture wounds surrounded by erythema and edema.  Exquisitely tender to palpation.  Suspect cellulitis secondary to infected cat bite.  She endorses recent rash when using a antibiotic prescribed.  She is unsure of which antibiotic but thinks it is amoxicillin.  As a result, unable to treat with preferred monotherapy Augmentin.  Will use Bactrim along with clindamycin to cover anaerobes.  Asked her to check with her pharmacy to verify which medication gave her the rash so her chart can be updated.  She will be asked to return for wound check in 1 week or sooner if symptoms do not improve. Will suggest warm water soak with epsom salts twice daily.  Reviewed chart history.   Counseled patient on potential for adverse effects with medications prescribed/recommended today, ER and return-to-clinic precautions discussed, patient verbalized understanding and agreement with care plan.  Final Clinical Impressions(s) / UC Diagnoses   Final diagnoses:  None   Discharge Instructions   None    ED Prescriptions   None    PDMP not reviewed this encounter.   Charma Igo, FNP 09/19/22 1023    ImmordinoJeannett Senior, FNP 09/19/22 1032

## 2022-09-19 NOTE — Discharge Instructions (Addendum)
Soak your foot twice a day in warm water with dissolved Epsom salts.  Apply betadine/iodone to the wound afterward and cover with a sterile dressing.  Return for wound check in 7 days or sooner if symptoms do not begin to improve.

## 2022-09-19 NOTE — ED Triage Notes (Signed)
Pt reports getting scratched by a neighborhood cat this past Friday on her left foot. States she has been cleaning the area and using bacitracin. Reports no concern for rabies.

## 2022-09-27 ENCOUNTER — Ambulatory Visit
Admission: EM | Admit: 2022-09-27 | Discharge: 2022-09-27 | Disposition: A | Discharge status: Home / Self Care | Payer: Managed Care, Other (non HMO) | Attending: Emergency Medicine | Admitting: Emergency Medicine

## 2022-09-27 DIAGNOSIS — S91352D Open bite, left foot, subsequent encounter: Secondary | ICD-10-CM | POA: Diagnosis not present

## 2022-09-27 DIAGNOSIS — S91332D Puncture wound without foreign body, left foot, subsequent encounter: Secondary | ICD-10-CM | POA: Diagnosis not present

## 2022-09-27 DIAGNOSIS — Z5189 Encounter for other specified aftercare: Secondary | ICD-10-CM

## 2022-09-27 DIAGNOSIS — Z23 Encounter for immunization: Secondary | ICD-10-CM | POA: Diagnosis not present

## 2022-09-27 DIAGNOSIS — W5501XD Bitten by cat, subsequent encounter: Secondary | ICD-10-CM

## 2022-09-27 MED ORDER — TETANUS-DIPHTH-ACELL PERTUSSIS 5-2.5-18.5 LF-MCG/0.5 IM SUSY
0.5000 mL | PREFILLED_SYRINGE | Freq: Once | INTRAMUSCULAR | Status: AC
  Administered 2022-09-27: 0.5 mL via INTRAMUSCULAR

## 2022-09-27 NOTE — ED Triage Notes (Signed)
Patient to Urgent Care for a wound check. Four puncture wounds present to left foot. Reports that she was bit by a neighborhood cat 5/17.   Patient was seen at this Urgent Care 5/20 and prescribed Bactrim and Clindamycin. Was advised by provider to return for wound recheck. Wound well healing. Has also been soaking in epsom salts/ cleansing with iodine.

## 2022-09-27 NOTE — Discharge Instructions (Addendum)
Your tetanus was updated today.    The wounds appear to be healing well.  Continue on your current treatment as directed and follow-up with your primary care provider as needed.

## 2022-09-27 NOTE — ED Provider Notes (Signed)
[facility]    CSN: [id number] Arrival date & time: 09/27/22  1828      History   Chief Complaint Chief Complaint  Patient presents with   Wound Check    HPI Leslie James is a 67 y.o. female.  Patient presents for recheck of a wound on her lower left foot that occurred a week ago.  She was bitten by a feral cat in her neighborhood.  She was seen at this urgent care on 09/19/2022; diagnosed with cat bite on her foot; treated with clindamycin and Bactrim.  She declined rabies treatment at that time.  She reports the wounds are healing well.  No fever or purulent drainage.  She has been taking her antibiotics and soaking the wounds.  Last tetanus unknown.  The history is provided by the patient and medical records.    Past Medical History:  Diagnosis Date   Depression    Hypertension     There are no problems to display for this patient.   Past Surgical History:  Procedure Laterality Date   ABDOMINAL HYSTERECTOMY     ABDOMINAL SURGERY     BREAST SURGERY     Tumor removal; benign   COLONOSCOPY N/A 05/07/2021   Procedure: COLONOSCOPY;  Surgeon: Jaynie Collins, DO;  Location: [facility] ENDOSCOPY;  Service: Gastroenterology;  Laterality: N/A;    OB History   No obstetric history on file.      Home Medications    Prior to Admission medications   Medication Sig Start Date End Date Taking? Authorizing Provider  amLODipine (NORVASC) 5 MG tablet Take 1 tablet (5 mg total) daily by mouth. 03/07/17 03/07/18  Willy Eddy, MD  cholecalciferol (VITAMIN D3) 25 MCG (1000 UNIT) tablet Take 1,000 Units by mouth daily.    [provider]  clindamycin (CLEOCIN) 300 MG capsule Take 1 capsule (300 mg total) by mouth 3 (three) times daily for 10 days. 09/19/22 09/29/22  Immordino, Jeannett Senior, FNP  escitalopram (LEXAPRO) 5 MG tablet Take 5 mg by mouth daily.    [provider]  hydrochlorothiazide (MICROZIDE) 12.5 MG capsule Take 12.5 mg by mouth daily.     [provider]  losartan (COZAAR) 25 MG tablet Take 25 mg by mouth daily.    [provider]  montelukast (SINGULAIR) 10 MG tablet Take 10 mg by mouth at bedtime.    [provider]  sulfamethoxazole-trimethoprim (BACTRIM DS) 800-160 MG tablet Take 1 tablet by mouth 2 (two) times daily for 10 days. 09/19/22 09/29/22  Immordino, Jeannett Senior, FNP    Family History History reviewed. No pertinent family history.  Social History Social History   Tobacco Use   Smoking status: Every Day    Packs/day: 1.00    Years: 30.00    Additional pack years: 0.00    Total pack years: 30.00    Types: Cigarettes   Smokeless tobacco: Never  Vaping Use   Vaping Use: Never used  Substance Use Topics   Alcohol use: Yes    Alcohol/week: 15.0 standard drinks of alcohol    Types: 15 Cans of beer per week    Comment: 3-4 beers 3-4x week   Drug use: No     Allergies   Bee venom, Codeine, Erythromycin, and Amoxicillin   Review of Systems Review of Systems  Constitutional:  Negative for chills and fever.  Musculoskeletal:  Negative for arthralgias, gait problem and joint swelling.  Skin:  Positive for wound. Negative for color change.  Neurological:  Negative for weakness and numbness.  All other systems reviewed and are negative.    Physical Exam Triage Vital Signs ED Triage Vitals  Enc Vitals Group     BP 09/27/22 2004 (!) 158/91     Pulse Rate 09/27/22 1951 95     Resp 09/27/22 1951 18     Temp 09/27/22 1951 98.1 F (36.7 C)     Temp src --      SpO2 09/27/22 1951 95 %     Weight --      Height --      Head Circumference --      Peak Flow --      Pain Score 09/27/22 2003 1     Pain Loc --      Pain Edu? --      Excl. in GC? --    No data found.  Updated Vital Signs BP (!) 158/91   Pulse 95   Temp 98.1 F (36.7 C)   Resp 18   SpO2 95%   Visual Acuity Right Eye Distance:   Left Eye Distance:   Bilateral Distance:    Right Eye Near:   Left Eye  Near:    Bilateral Near:     Physical Exam Constitutional:      General: She is not in acute distress.    Appearance: Normal appearance. She is not ill-appearing.  HENT:     Mouth/Throat:     Mouth: Mucous membranes are moist.  Cardiovascular:     Rate and Rhythm: Normal rate and regular rhythm.  Pulmonary:     Effort: Pulmonary effort is normal. No respiratory distress.  Musculoskeletal:        General: No swelling, tenderness, deformity or signs of injury. Normal range of motion.  Skin:    General: Skin is warm and dry.     Findings: Lesion present. No erythema.     Comments: 4 puncture wounds on plantar surface of the left foot are healing well.  No erythema or drainage.  Neurological:     General: No focal deficit present.     Mental Status: She is alert and oriented to person, place, and time.     Sensory: No sensory deficit.     Motor: No weakness.     Gait: Gait normal.  Psychiatric:        Mood and Affect: Mood normal.        Behavior: Behavior normal.      UC Treatments / Results  Labs (all labs ordered are listed, but only abnormal results are displayed) Labs Reviewed - No data to display  EKG   Radiology No results found.  Procedures Procedures (including critical care time)  Medications Ordered in UC Medications  Tdap (BOOSTRIX) injection 0.5 mL (0.5 mLs Intramuscular Given 09/27/22 2030)    Initial Impression / Assessment and Plan / UC Course  I have reviewed the triage vital signs and the nursing notes.  Pertinent labs & imaging results that were available during my care of the patient were reviewed by me and considered in my medical decision making (see chart for details).    Puncture wounds due to cat bite of the left foot.  Wound recheck today and puncture wounds appear to be healing well.  The cat bite was from a feral cat in her neighborhood.  She declined rabies treatment at the time of injury.  Tetanus updated today.  Instructed patient to  continue current treatment plan as the wounds  appear to be healing well.  Instructed her to follow-up with her PCP as needed.  She agrees to plan of care.     Final Clinical Impressions(s) / UC Diagnoses   Final diagnoses:  Puncture wound of left foot, subsequent encounter  Cat bite of foot, left, subsequent encounter  Encounter for wound re-check     Discharge Instructions      Your tetanus was updated today.    The wounds appear to be healing well.  Continue on your current treatment as directed and follow-up with your primary care provider as needed.     ED Prescriptions   None    PDMP not reviewed this encounter.   Mickie Bail, NP 09/27/22 2031
